# Patient Record
Sex: Male | Born: 1995 | Race: White | Hispanic: No | Marital: Single | Smoking: Never smoker
Health system: Southern US, Community
[De-identification: ages and names within clinical notes are randomized; demographics above are authoritative.]

## PROBLEM LIST (undated history)

## (undated) DIAGNOSIS — F913 Oppositional defiant disorder: Secondary | ICD-10-CM

## (undated) DIAGNOSIS — F329 Major depressive disorder, single episode, unspecified: Secondary | ICD-10-CM

## (undated) DIAGNOSIS — R519 Headache, unspecified: Secondary | ICD-10-CM

## (undated) DIAGNOSIS — K219 Gastro-esophageal reflux disease without esophagitis: Secondary | ICD-10-CM

## (undated) DIAGNOSIS — R51 Headache: Secondary | ICD-10-CM

## (undated) DIAGNOSIS — F32A Depression, unspecified: Secondary | ICD-10-CM

## (undated) DIAGNOSIS — F909 Attention-deficit hyperactivity disorder, unspecified type: Secondary | ICD-10-CM

## (undated) DIAGNOSIS — F419 Anxiety disorder, unspecified: Secondary | ICD-10-CM

## (undated) DIAGNOSIS — Z6221 Child in welfare custody: Secondary | ICD-10-CM

## (undated) HISTORY — DX: Gastro-esophageal reflux disease without esophagitis: K21.9

## (undated) HISTORY — DX: Child in welfare custody: Z62.21

## (undated) HISTORY — DX: Headache: R51

## (undated) HISTORY — DX: Headache, unspecified: R51.9

---
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
## 2017-01-17 ENCOUNTER — Ambulatory Visit: Payer: Self-pay | Admitting: Family Medicine

## 2017-01-22 ENCOUNTER — Ambulatory Visit: Payer: Medicaid Other | Admitting: Family Medicine

## 2017-01-29 IMAGING — CR DG HIP (WITH OR WITHOUT PELVIS) 2-3V*R*
1 series · 3 of 3 positions shown · non-contrast
Comparison: None.

CLINICAL DATA: Sudden recurrence of right hip pain. The patient
injured the hip 1 month ago.

EXAM:
DG HIP (WITH OR WITHOUT PELVIS) 2-3V RIGHT

[Series 1: dg hip unilat w or w/o pelvis 2-3 views  · non-contrast · 0.14mm/px · 3 of 3 slices shown]
[im 1/3]
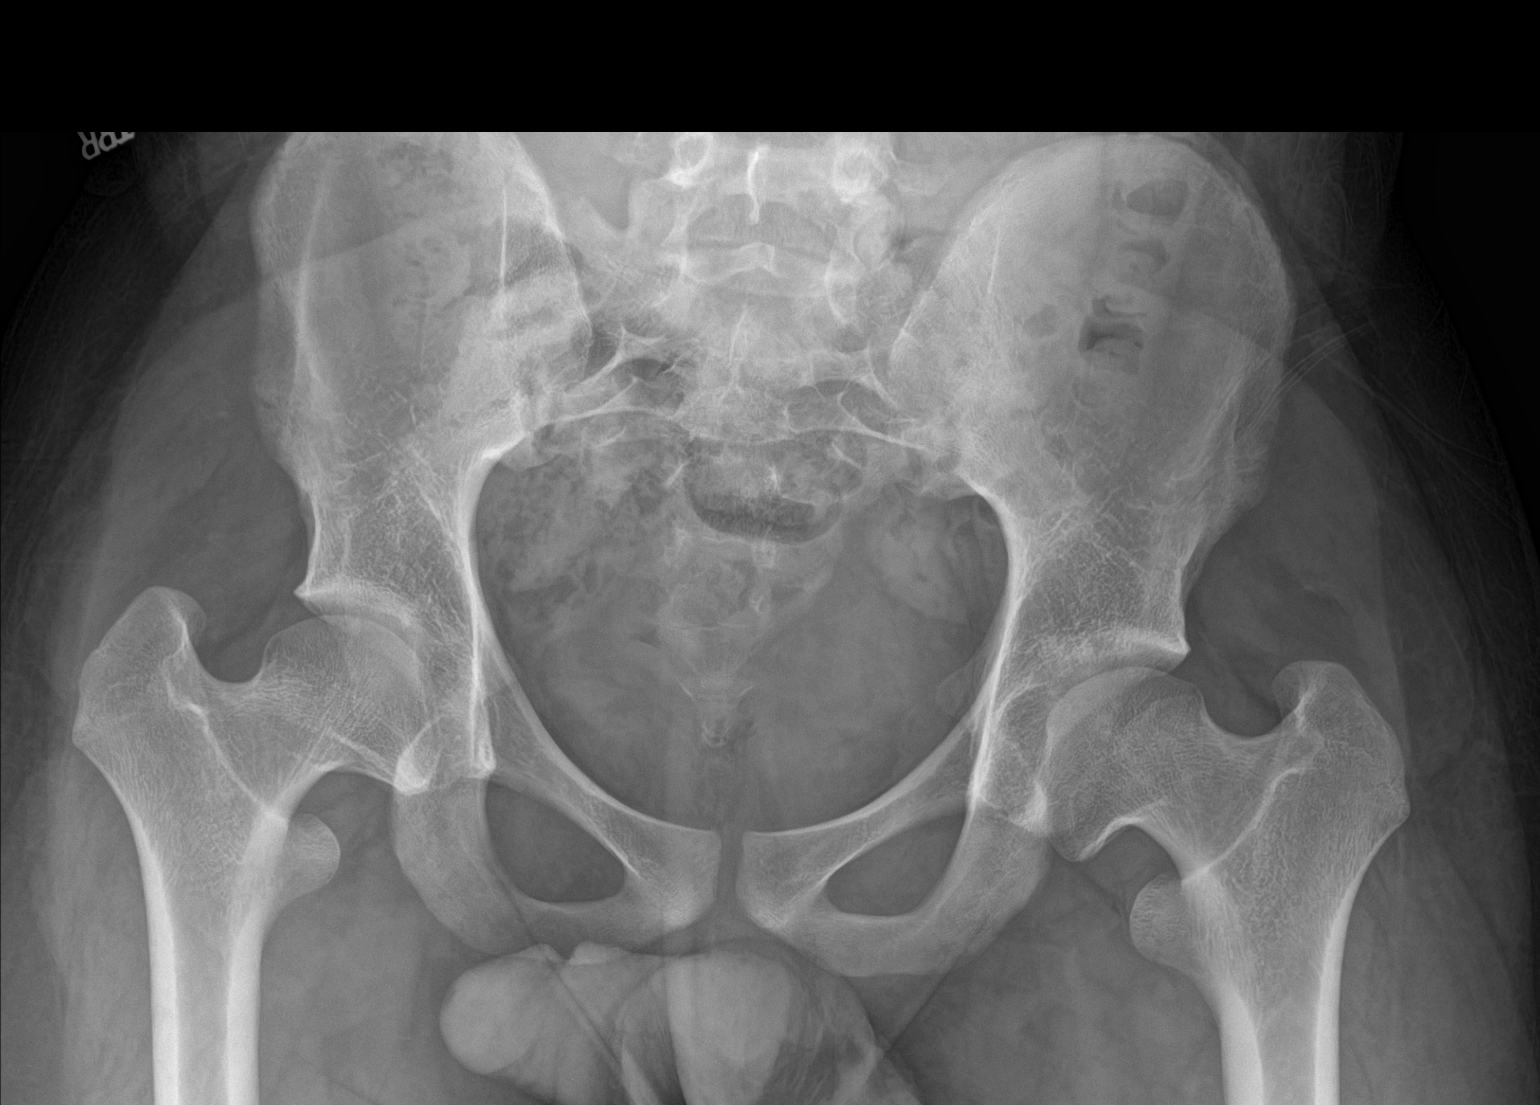
[im 2/3]
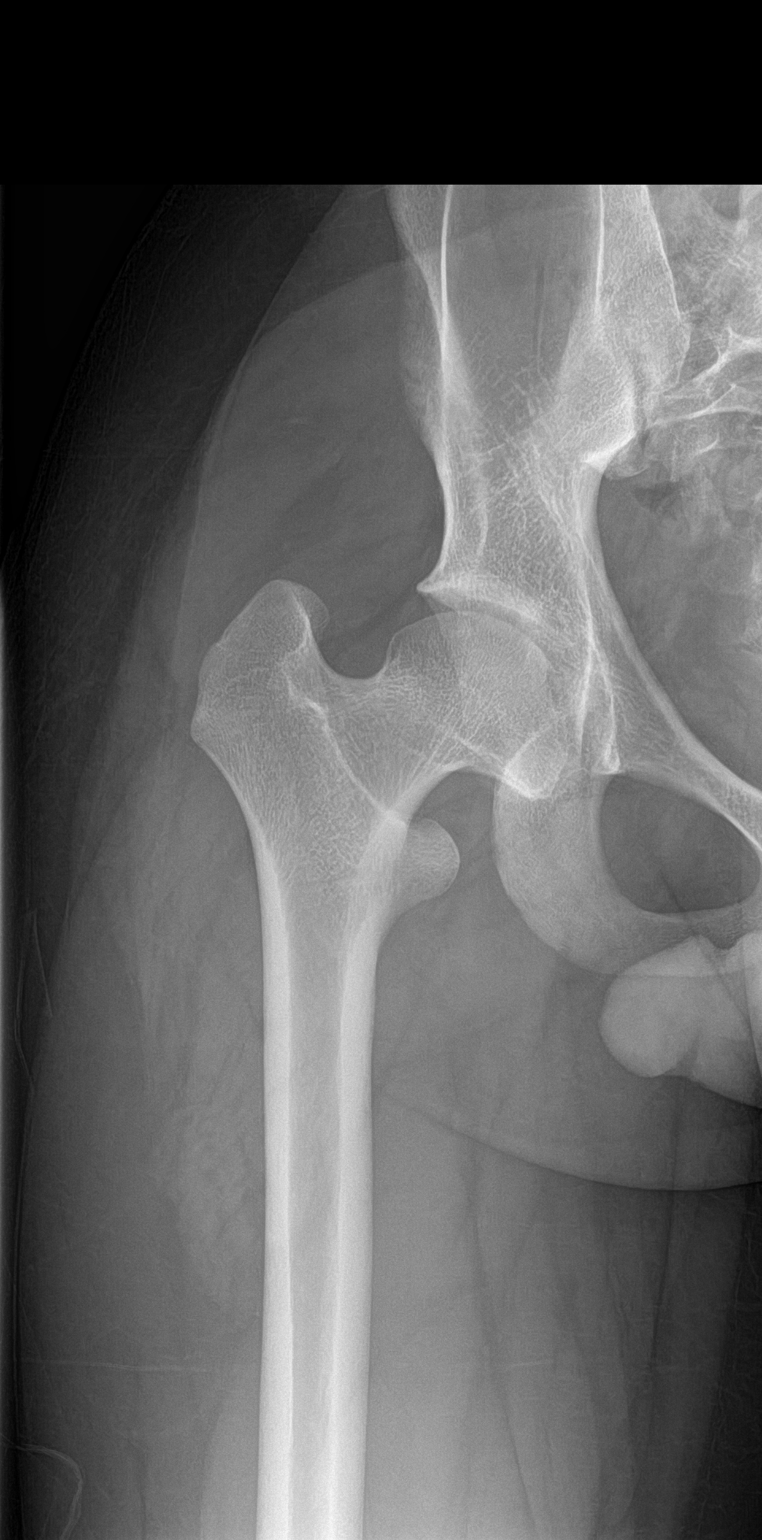
[im 3/3]
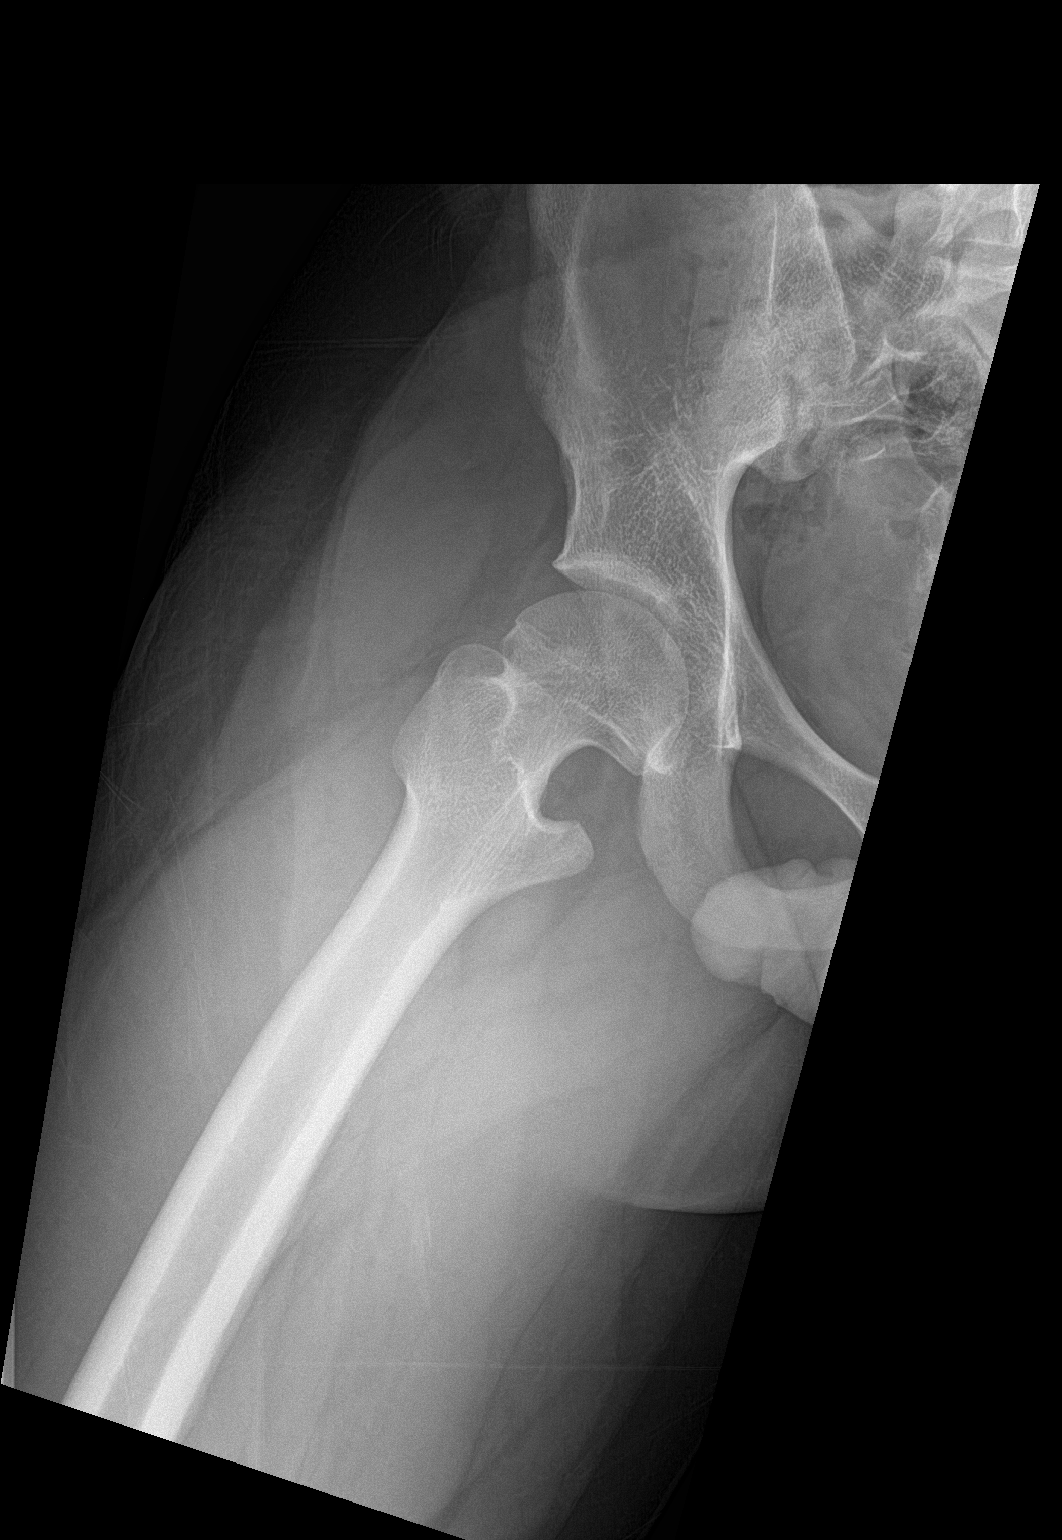

[3 of 3 positions shown; findings below may reference images not displayed]

FINDINGS: Both femoral heads are symmetrically malformed. No fracture or
dislocation is seen.
IMPRESSION: 1. No acute abnormality.
2. Symmetrically malformed femoral heads bilaterally. These could be
due to old, healed slipped capital femoral epiphyses.
